# Patient Record
Sex: Female | Born: 1965 | Race: Black or African American | Hispanic: No | Marital: Single | Smoking: Former smoker
Health system: Southern US, Community
[De-identification: ages and names within clinical notes are randomized; demographics above are authoritative.]

## PROBLEM LIST (undated history)

## (undated) DIAGNOSIS — I10 Essential (primary) hypertension: Secondary | ICD-10-CM

## (undated) DIAGNOSIS — D649 Anemia, unspecified: Secondary | ICD-10-CM

## (undated) DIAGNOSIS — I2510 Atherosclerotic heart disease of native coronary artery without angina pectoris: Secondary | ICD-10-CM

## (undated) HISTORY — DX: Anemia, unspecified: D64.9

---

## 1998-12-11 ENCOUNTER — Emergency Department (HOSPITAL_COMMUNITY): Admission: EM | Admit: 1998-12-11 | Discharge: 1998-12-11 | Payer: Self-pay | Admitting: Emergency Medicine

## 2000-06-26 ENCOUNTER — Other Ambulatory Visit: Admission: RE | Admit: 2000-06-26 | Discharge: 2000-06-26 | Payer: Self-pay | Admitting: Obstetrics and Gynecology

## 2000-06-30 ENCOUNTER — Ambulatory Visit (HOSPITAL_COMMUNITY): Admission: RE | Admit: 2000-06-30 | Discharge: 2000-06-30 | Payer: Self-pay | Admitting: Obstetrics and Gynecology

## 2000-06-30 ENCOUNTER — Encounter (INDEPENDENT_AMBULATORY_CARE_PROVIDER_SITE_OTHER): Payer: Self-pay | Admitting: *Deleted

## 2006-05-18 ENCOUNTER — Ambulatory Visit (HOSPITAL_COMMUNITY): Admission: RE | Admit: 2006-05-18 | Discharge: 2006-05-18 | Payer: Self-pay | Admitting: *Deleted

## 2006-06-08 ENCOUNTER — Encounter (INDEPENDENT_AMBULATORY_CARE_PROVIDER_SITE_OTHER): Payer: Self-pay | Admitting: *Deleted

## 2006-06-08 ENCOUNTER — Ambulatory Visit (HOSPITAL_COMMUNITY): Admission: RE | Admit: 2006-06-08 | Discharge: 2006-06-08 | Payer: Self-pay | Admitting: *Deleted

## 2008-01-21 ENCOUNTER — Ambulatory Visit (HOSPITAL_COMMUNITY): Admission: RE | Admit: 2008-01-21 | Discharge: 2008-01-21 | Payer: Self-pay | Admitting: Obstetrics and Gynecology

## 2009-03-18 ENCOUNTER — Ambulatory Visit (HOSPITAL_COMMUNITY): Admission: RE | Admit: 2009-03-18 | Discharge: 2009-03-18 | Payer: Self-pay | Admitting: Obstetrics and Gynecology

## 2010-01-22 ENCOUNTER — Other Ambulatory Visit: Payer: Self-pay | Admitting: Obstetrics and Gynecology

## 2010-01-27 ENCOUNTER — Ambulatory Visit: Payer: Self-pay | Admitting: Hematology and Oncology

## 2010-02-09 ENCOUNTER — Encounter (HOSPITAL_BASED_OUTPATIENT_CLINIC_OR_DEPARTMENT_OTHER): Payer: Private Health Insurance - Indemnity | Admitting: Hematology and Oncology

## 2010-02-09 DIAGNOSIS — D509 Iron deficiency anemia, unspecified: Secondary | ICD-10-CM

## 2010-02-10 ENCOUNTER — Other Ambulatory Visit: Payer: Self-pay | Admitting: Hematology and Oncology

## 2010-02-10 ENCOUNTER — Encounter (HOSPITAL_BASED_OUTPATIENT_CLINIC_OR_DEPARTMENT_OTHER): Payer: Private Health Insurance - Indemnity | Admitting: Hematology and Oncology

## 2010-02-10 DIAGNOSIS — D509 Iron deficiency anemia, unspecified: Secondary | ICD-10-CM

## 2010-02-10 LAB — URINALYSIS, MICROSCOPIC - CHCC
Bilirubin (Urine): NEGATIVE
Glucose: NEGATIVE g/dL
Nitrite: NEGATIVE
Specific Gravity, Urine: 1.005 (ref 1.003–1.035)

## 2010-02-10 LAB — MORPHOLOGY: PLT EST: INCREASED

## 2010-02-10 LAB — COMPREHENSIVE METABOLIC PANEL
Alkaline Phosphatase: 65 U/L (ref 39–117)
BUN: 9 mg/dL (ref 6–23)
CO2: 26 mEq/L (ref 19–32)
Glucose, Bld: 97 mg/dL (ref 70–99)
Sodium: 141 mEq/L (ref 135–145)
Total Protein: 7.8 g/dL (ref 6.0–8.3)

## 2010-02-10 LAB — CBC & DIFF AND RETIC
BASO%: 1.3 % (ref 0.0–2.0)
EOS%: 9.2 % — ABNORMAL HIGH (ref 0.0–7.0)
Eosinophils Absolute: 0.5 10*3/uL (ref 0.0–0.5)
Immature Retic Fract: 19.4 % — ABNORMAL HIGH (ref 0.00–10.70)
LYMPH%: 29.4 % (ref 14.0–49.7)
MCHC: 27.9 g/dL — ABNORMAL LOW (ref 31.5–36.0)
RDW: 23.6 % — ABNORMAL HIGH (ref 11.2–14.5)
Retic %: 1.23 % (ref 0.50–1.50)
WBC: 5.3 10*3/uL (ref 3.9–10.3)
lymph#: 1.6 10*3/uL (ref 0.9–3.3)

## 2010-02-11 ENCOUNTER — Other Ambulatory Visit: Payer: Self-pay | Admitting: Obstetrics and Gynecology

## 2010-02-12 ENCOUNTER — Encounter (HOSPITAL_BASED_OUTPATIENT_CLINIC_OR_DEPARTMENT_OTHER): Payer: Private Health Insurance - Indemnity | Admitting: Hematology and Oncology

## 2010-02-12 DIAGNOSIS — D509 Iron deficiency anemia, unspecified: Secondary | ICD-10-CM

## 2010-02-12 LAB — HEMOGLOBINOPATHY EVALUATION
Hgb A2 Quant: 1.8 % — ABNORMAL LOW (ref 2.2–3.2)
Hgb F Quant: 0 % (ref 0.0–2.0)

## 2010-02-12 LAB — DIRECT ANTIGLOBULIN TEST (NOT AT ARMC)
DAT (Complement): NEGATIVE
DAT IgG: NEGATIVE

## 2010-02-12 LAB — IRON AND TIBC
Iron: 269 ug/dL — ABNORMAL HIGH (ref 42–145)
TIBC: 460 ug/dL (ref 250–470)

## 2010-02-12 LAB — FOLATE: Folate: 11.7 ng/mL

## 2010-02-12 LAB — FERRITIN: Ferritin: 5 ng/mL — ABNORMAL LOW (ref 10–291)

## 2010-03-25 ENCOUNTER — Other Ambulatory Visit: Payer: Self-pay | Admitting: Hematology and Oncology

## 2010-03-25 ENCOUNTER — Encounter (HOSPITAL_BASED_OUTPATIENT_CLINIC_OR_DEPARTMENT_OTHER): Payer: Private Health Insurance - Indemnity | Admitting: Hematology and Oncology

## 2010-03-25 DIAGNOSIS — D509 Iron deficiency anemia, unspecified: Secondary | ICD-10-CM

## 2010-03-25 LAB — CBC WITH DIFFERENTIAL/PLATELET
Basophils Absolute: 0 10*3/uL (ref 0.0–0.1)
EOS%: 8.7 % — ABNORMAL HIGH (ref 0.0–7.0)
HGB: 11.6 g/dL (ref 11.6–15.9)
MCH: 26.1 pg (ref 25.1–34.0)
MCHC: 33 g/dL (ref 31.5–36.0)
MCV: 79.1 fL — ABNORMAL LOW (ref 79.5–101.0)
MONO#: 0.3 10*3/uL (ref 0.1–0.9)
MONO%: 6.1 % (ref 0.0–14.0)
NEUT#: 2.2 10*3/uL (ref 1.5–6.5)
NEUT%: 47.3 % (ref 38.4–76.8)
WBC: 4.6 10*3/uL (ref 3.9–10.3)
nRBC: 0 % (ref 0–0)

## 2010-03-25 LAB — IRON AND TIBC
TIBC: 244 ug/dL — ABNORMAL LOW (ref 250–470)
UIBC: 181 ug/dL

## 2010-03-25 LAB — BASIC METABOLIC PANEL
BUN: 6 mg/dL (ref 6–23)
CO2: 25 mEq/L (ref 19–32)
Glucose, Bld: 101 mg/dL — ABNORMAL HIGH (ref 70–99)
Potassium: 3.6 mEq/L (ref 3.5–5.3)

## 2010-03-25 LAB — FERRITIN: Ferritin: 191 ng/mL (ref 10–291)

## 2010-04-01 ENCOUNTER — Encounter (HOSPITAL_BASED_OUTPATIENT_CLINIC_OR_DEPARTMENT_OTHER): Payer: Private Health Insurance - Indemnity | Admitting: Hematology and Oncology

## 2010-04-01 DIAGNOSIS — D509 Iron deficiency anemia, unspecified: Secondary | ICD-10-CM

## 2010-04-12 ENCOUNTER — Other Ambulatory Visit (HOSPITAL_COMMUNITY): Payer: Self-pay | Admitting: Obstetrics and Gynecology

## 2010-04-12 DIAGNOSIS — Z1231 Encounter for screening mammogram for malignant neoplasm of breast: Secondary | ICD-10-CM

## 2010-04-16 ENCOUNTER — Ambulatory Visit (HOSPITAL_COMMUNITY): Payer: Private Health Insurance - Indemnity

## 2010-04-23 ENCOUNTER — Ambulatory Visit (HOSPITAL_COMMUNITY): Payer: Private Health Insurance - Indemnity

## 2010-04-30 ENCOUNTER — Ambulatory Visit (HOSPITAL_COMMUNITY)
Admission: RE | Admit: 2010-04-30 | Discharge: 2010-04-30 | Disposition: A | Discharge status: Home / Self Care | Payer: Private Health Insurance - Indemnity | Source: Ambulatory Visit | Attending: Obstetrics and Gynecology | Admitting: Obstetrics and Gynecology

## 2010-04-30 DIAGNOSIS — Z1231 Encounter for screening mammogram for malignant neoplasm of breast: Secondary | ICD-10-CM

## 2010-05-18 NOTE — Op Note (Signed)
NAME:  Yolanda Barr, Yolanda Barr NO.:  [id number]   MEDICAL RECORD NO.:  [id number]          PATIENT TYPE:  AMB   LOCATION:  SDC                           FACILITY:  [facility]   PHYSICIAN:   B. Earlene Plater, M.D.  DATE OF BIRTH:  [birth year]   DATE OF PROCEDURE:  06/08/2006  DATE OF DISCHARGE:                               OPERATIVE REPORT   PREOPERATIVE DIAGNOSIS:  Abnormal uterine bleeding and submucosal  fibroid.   POSTOPERATIVE DIAGNOSIS:  Abnormal uterine bleeding and submucosal  fibroid.   PROCEDURE:  Hysteroscopy with resection of submucosal fibroid.   SURGEON:  Chester Holstein. Earlene Plater, M.D.   ASSISTANT:  None.   ANESTHESIA:  LMA general.   SPECIMENS:  Submucosal fibroid to pathology.   BLOOD LOSS:  Minimal.   FLUID DEFICIENT:  350 mL sorbitol.   INDICATIONS:  Patient with a history of heavy menstrual bleeding with  associated anemia, initially down into the 7 range. Ultrasound showed a  uterine fibroid, submucosal probably. Saline infusion ultrasound  confirmed presence of a large submucosal fibroid. The patient was  advised of the risks of surgery including infection, bleeding, uterine  perforation and fluid overload.   PROCEDURE:  The patient was taken to the operating room and LMA  anesthesia obtained. She was prepped and draped in the standard fashion.  Bladder emptied with an in-and-out catheter. Exam under anesthesia had  approximately 12-week size uterus which was anteverted; no adnexal  masses.   Speculum inserted. Paracervical block placed with 20 mL of 1% Nesacaine.   Single tooth was attached to the anterior lip after speculum placed.  Cervix easily allowed passage up to the #31 dilator.   The resectoscope was inserted after being flushed with sorbitol. Good  uterine distention obtained. The primary finding was an approximately 4  cm submucosal fibroid arising from the left anterior fundal region. In  addition, an approximately 1 cm submucosal fibroid  noted from the right  lateral anterior region. Each was resected to its base with the single  loop. Hemostasis obtained. All debris removed and cervix inspected,  found to be hemostatic.   The patient tolerated the procedure well with no complications. She was  taken to the recovery room awake, alert, in stable condition.      Gerri Spore B. Earlene Plater, M.D.  Electronically Signed     WBD/MEDQ  D:  06/08/2006  T:  06/08/2006  Job:  [id number]

## 2010-05-21 NOTE — Op Note (Signed)
[facility]  Patient:    Yolanda Barr, Yolanda Barr                    MRN: [id number] Proc. Date: 06/30/00 Adm. Date:  [id number] Attending:  Conley Simmonds A                           Operative Report  PREOPERATIVE DIAGNOSIS:       Prolapse of cervical fibroids.  POSTOPERATIVE DIAGNOSIS:      Prolapse of cervical fibroids.  OPERATION:                    Cervical myomectomy.  SURGEON:                      Brook A. Edward Jolly, M.D.  ANESTHESIA:                   MAC, paracervical block with 1% lidocaine.  IV FLUIDS:                    13000 cc Ringers lactate.  Estimated blood loss 50 cc.  Urine output 10 cc prior to procedure and 600 cc after procedure.  COMPLICATIONS:  None.  INDICATIONS FOR PROCEDURE:  The patient is a 45 year old, gravida 15, PARA 2-0-2-2, African-American female who presented to the office with menorrhagia. The patient was noted to have a prolapsing cervical fibroid at that time, and ultrasound confirmed the presence of a 3.7 cm cervical fibroid in addition to a 1.8 cm fundal fibroid.  The ovaries were noted to be normal.  The patients hemoglobin was noted to be 8.3, and she had been previously diagnosed with anemia which was under treatment with iron therapy.  A discussion was held with the patient regarding her diagnosis and options for care, and a decision was made to proceed with a cervical myomectomy after the risks and benefits were discussed with her.  FINDINGS:  Examination under anesthesia revealed a 4 x 2 cm cervical prolapsing myoma on a large stalk which was attached to the left anterior cervix near the level of the internal os.  The uterus was noted to be small, anteverted, and mobile.  No adnexal masses were appreciated.  SPECIMENS:  Cervical fibroids.  DESCRIPTION OF PROCEDURE:  With an IV placed, the patient was taken to the operating suite after she was properly identified.  The patient did receive Ancef 1 g IV for  antibiotic prophylaxis.  The patient was placed in the supine position on the operating room table, and MAC anesthesia was begun.  The patient was placed in the dorsolithotomy position, and the perineum and vagina were sterilely prepped and draped.  The bladder was emptied of urine with a sterile red rubber catheter.  Examination under anesthesia was performed, and the findings are as noted above.  A speculum was placed inside the vagina, and the prolapsing fibroid was identified.  It was grasped with tenaculum, and monopolar cautery was used to begin with the myomectomy at the base of the fibroid.  An attempt was made to clamp the remaining stalk of the cervical fibroid and place a free tie; however, this was not easily accessible.  The remainder of the base of the cervical fibroid was, therefore, cauterized using monopolar cautery. Hemostasis then became good.  To ensure ongoing hemostasis, at this time a figure-of-eight suture of #1 chromic was placed externally on the  cervix at the level of the internal os in order to achieve occlusion of the descending branches of the uterine artery bilaterally.  At the end of the procedure, hemostasis was noted to be excellent.  The bladder was again catheterized, and clear urine was noted.  The patient was taken out of the dorsolithotomy position after all instruments were removed from the vagina.  She was escorted to the recovery room in stable and awake condition.  There were no complications of the procedure.  All needle, instrument, and laparotomy pack counts were correct. DD:  06/30/00 TD:  06/30/00 Job: [id number]

## 2010-06-30 ENCOUNTER — Other Ambulatory Visit: Payer: Self-pay | Admitting: Hematology and Oncology

## 2010-06-30 ENCOUNTER — Encounter (HOSPITAL_BASED_OUTPATIENT_CLINIC_OR_DEPARTMENT_OTHER): Payer: Private Health Insurance - Indemnity | Admitting: Hematology and Oncology

## 2010-06-30 DIAGNOSIS — D509 Iron deficiency anemia, unspecified: Secondary | ICD-10-CM

## 2010-06-30 LAB — BASIC METABOLIC PANEL
Calcium: 9.3 mg/dL (ref 8.4–10.5)
Potassium: 4.2 mEq/L (ref 3.5–5.3)
Sodium: 138 mEq/L (ref 135–145)

## 2010-06-30 LAB — IRON AND TIBC
%SAT: 14 % — ABNORMAL LOW (ref 20–55)
Iron: 48 ug/dL (ref 42–145)
TIBC: 348 ug/dL (ref 250–470)
UIBC: 300 ug/dL

## 2010-06-30 LAB — CBC WITH DIFFERENTIAL/PLATELET
BASO%: 1.3 % (ref 0.0–2.0)
Basophils Absolute: 0.1 10*3/uL (ref 0.0–0.1)
EOS%: 7.9 % — ABNORMAL HIGH (ref 0.0–7.0)
HGB: 12.2 g/dL (ref 11.6–15.9)
LYMPH%: 33.7 % (ref 14.0–49.7)
MCH: 31 pg (ref 25.1–34.0)
MCHC: 33.7 g/dL (ref 31.5–36.0)
MONO#: 0.3 10*3/uL (ref 0.1–0.9)
NEUT#: 2.1 10*3/uL (ref 1.5–6.5)
RBC: 3.95 10*6/uL (ref 3.70–5.45)
WBC: 4.3 10*3/uL (ref 3.9–10.3)

## 2010-07-02 ENCOUNTER — Encounter (HOSPITAL_BASED_OUTPATIENT_CLINIC_OR_DEPARTMENT_OTHER): Payer: Private Health Insurance - Indemnity | Admitting: Hematology and Oncology

## 2010-07-02 DIAGNOSIS — D509 Iron deficiency anemia, unspecified: Secondary | ICD-10-CM

## 2010-07-30 ENCOUNTER — Encounter (HOSPITAL_BASED_OUTPATIENT_CLINIC_OR_DEPARTMENT_OTHER): Payer: Private Health Insurance - Indemnity | Admitting: Hematology and Oncology

## 2010-07-30 DIAGNOSIS — N920 Excessive and frequent menstruation with regular cycle: Secondary | ICD-10-CM

## 2010-07-30 DIAGNOSIS — D649 Anemia, unspecified: Secondary | ICD-10-CM

## 2010-10-21 LAB — DIFFERENTIAL
Basophils Absolute: 0.1
Basophils Relative: 1
Monocytes Absolute: 0.3
Neutro Abs: 2.3
Neutrophils Relative %: 53

## 2010-10-21 LAB — CBC
MCHC: 30.8
Platelets: 393
RDW: 31.6 — ABNORMAL HIGH

## 2010-11-09 ENCOUNTER — Other Ambulatory Visit: Payer: Self-pay | Admitting: Hematology and Oncology

## 2010-11-09 ENCOUNTER — Encounter: Payer: Self-pay | Admitting: *Deleted

## 2010-11-09 ENCOUNTER — Other Ambulatory Visit (HOSPITAL_BASED_OUTPATIENT_CLINIC_OR_DEPARTMENT_OTHER): Payer: Private Health Insurance - Indemnity | Admitting: Lab

## 2010-11-09 DIAGNOSIS — D509 Iron deficiency anemia, unspecified: Secondary | ICD-10-CM

## 2010-11-09 LAB — BASIC METABOLIC PANEL
BUN: 6 mg/dL (ref 6–23)
Calcium: 9.4 mg/dL (ref 8.4–10.5)
Creatinine, Ser: 0.76 mg/dL (ref 0.50–1.10)
Glucose, Bld: 78 mg/dL (ref 70–99)
Potassium: 3.4 mEq/L — ABNORMAL LOW (ref 3.5–5.3)

## 2010-11-09 LAB — CBC WITH DIFFERENTIAL/PLATELET
Basophils Absolute: 0 10*3/uL (ref 0.0–0.1)
EOS%: 6.2 % (ref 0.0–7.0)
Eosinophils Absolute: 0.3 10*3/uL (ref 0.0–0.5)
HCT: 36.4 % (ref 34.8–46.6)
HGB: 12.1 g/dL (ref 11.6–15.9)
LYMPH%: 33.8 % (ref 14.0–49.7)
MCH: 30.8 pg (ref 25.1–34.0)
MCV: 92.5 fL (ref 79.5–101.0)
MONO%: 7 % (ref 0.0–14.0)
NEUT#: 2.3 10*3/uL (ref 1.5–6.5)
NEUT%: 52 % (ref 38.4–76.8)
Platelets: 357 10*3/uL (ref 145–400)

## 2010-11-09 LAB — IRON AND TIBC: %SAT: 9 % — ABNORMAL LOW (ref 20–55)

## 2010-11-09 LAB — FERRITIN: Ferritin: 16 ng/mL (ref 10–291)

## 2010-11-13 NOTE — Progress Notes (Signed)
This encounter was created in error - please disregard.

## 2011-01-15 ENCOUNTER — Telehealth: Payer: Self-pay | Admitting: Hematology and Oncology

## 2011-01-15 NOTE — Telephone Encounter (Signed)
lmonvm for pt re appts for 1/31 and 2/6. Schedule mailed today.

## 2011-01-31 ENCOUNTER — Encounter: Payer: Self-pay | Admitting: Nurse Practitioner

## 2011-02-02 ENCOUNTER — Other Ambulatory Visit: Payer: Self-pay | Admitting: Nurse Practitioner

## 2011-02-02 ENCOUNTER — Other Ambulatory Visit: Payer: Private Health Insurance - Indemnity | Admitting: Lab

## 2011-02-02 DIAGNOSIS — D509 Iron deficiency anemia, unspecified: Secondary | ICD-10-CM

## 2011-02-09 ENCOUNTER — Ambulatory Visit: Payer: Private Health Insurance - Indemnity | Admitting: Hematology and Oncology

## 2011-02-10 ENCOUNTER — Telehealth: Payer: Self-pay | Admitting: *Deleted

## 2011-02-10 NOTE — Telephone Encounter (Signed)
Pt returned nurse's call.    Informed pt that pt will be given an appt  As soon as schedule slot avaiable.   Instructed pt to f/u with her primary for  any issues until pt has appt with Dr. Dalene Carrow.   Pt voiced understanding. Pt's  Phone     [phone].

## 2011-02-25 ENCOUNTER — Other Ambulatory Visit: Payer: Self-pay | Admitting: *Deleted

## 2011-02-28 ENCOUNTER — Other Ambulatory Visit: Payer: Self-pay | Admitting: *Deleted

## 2011-03-02 ENCOUNTER — Telehealth: Payer: Self-pay | Admitting: Hematology and Oncology

## 2011-03-02 NOTE — Telephone Encounter (Signed)
lmonvm for pt re appt for 3/6 and 3/13. Schedule mailed.

## 2011-03-09 ENCOUNTER — Other Ambulatory Visit (HOSPITAL_BASED_OUTPATIENT_CLINIC_OR_DEPARTMENT_OTHER): Payer: Private Health Insurance - Indemnity | Admitting: Lab

## 2011-03-09 DIAGNOSIS — D509 Iron deficiency anemia, unspecified: Secondary | ICD-10-CM

## 2011-03-09 LAB — CBC WITH DIFFERENTIAL/PLATELET
Basophils Absolute: 0 10*3/uL (ref 0.0–0.1)
EOS%: 5.5 % (ref 0.0–7.0)
HCT: 29 % — ABNORMAL LOW (ref 34.8–46.6)
HGB: 9 g/dL — ABNORMAL LOW (ref 11.6–15.9)
LYMPH%: 35.7 % (ref 14.0–49.7)
MCH: 23 pg — ABNORMAL LOW (ref 25.1–34.0)
NEUT%: 50.2 % (ref 38.4–76.8)
Platelets: 336 10*3/uL (ref 145–400)
lymph#: 1.7 10*3/uL (ref 0.9–3.3)

## 2011-03-09 LAB — IRON AND TIBC
%SAT: 3 % — ABNORMAL LOW (ref 20–55)
Iron: 11 ug/dL — ABNORMAL LOW (ref 42–145)
UIBC: 403 ug/dL — ABNORMAL HIGH (ref 125–400)

## 2011-03-09 LAB — BASIC METABOLIC PANEL
Calcium: 9.2 mg/dL (ref 8.4–10.5)
Sodium: 137 mEq/L (ref 135–145)

## 2011-03-16 ENCOUNTER — Ambulatory Visit (HOSPITAL_BASED_OUTPATIENT_CLINIC_OR_DEPARTMENT_OTHER): Payer: Private Health Insurance - Indemnity | Admitting: Hematology and Oncology

## 2011-03-16 ENCOUNTER — Other Ambulatory Visit: Payer: Self-pay | Admitting: *Deleted

## 2011-03-16 ENCOUNTER — Encounter: Payer: Self-pay | Admitting: Hematology and Oncology

## 2011-03-16 VITALS — BP 125/72 | HR 59 | Temp 97.2°F | Ht 64.0 in | Wt 173.5 lb

## 2011-03-16 DIAGNOSIS — D539 Nutritional anemia, unspecified: Secondary | ICD-10-CM

## 2011-03-16 DIAGNOSIS — N924 Excessive bleeding in the premenopausal period: Secondary | ICD-10-CM

## 2011-03-16 MED ORDER — FE FUM-VIT C-VIT B12-FA 460-60-0.01-1 MG PO CAPS: 1.0000 | ORAL_CAPSULE | Freq: Every day | ORAL | Status: DC

## 2011-03-16 NOTE — Patient Instructions (Signed)
Patient to follow up as instructed.   Current Outpatient Prescriptions  Medication Sig Dispense Refill  . Fe Fum-Vit C-Vit B12-FA (HEMATOGEN FORTE PO) Take 1 tablet by mouth daily.      . Multiple Vitamins-Iron (ONE-TABLET-DAILY/IRON) TABS Take 1 tablet by mouth daily.        . psyllium (HYDROCIL/METAMUCIL) 95 % PACK Take 3 packets by mouth every other day.              March 2013  Sunday Monday Tuesday Wednesday Thursday Friday Saturday                  1   2    3   4   5   6    LAB MO     3:30 PM  (15 min.)  Delcie Roch  [facility] MEDICAL ONCOL[facility] 7   8   9    10   11   12   13    EST PT 30  12:15 PM  (30 min.)  Laurice Record, MD  [facility] MEDICAL ONCOLOGY 14   15   16    17   18   19   20   21   22   23    24   25   26   27   28   29   30    31                             Laurice Record., M.D.

## 2011-03-16 NOTE — Progress Notes (Signed)
This office note has been dictated.

## 2011-03-17 NOTE — Progress Notes (Signed)
CC:   Lynden Ang, NP Jyothi Elsie Amis, MD, [facility] Rubin Payor, DO  IDENTIFYING STATEMENT:  The patient is a 46 year old woman with anemia who presents for followup.  INTERVAL HISTORY:  Ms. Reardon has had scheduling issues with following up here at the [facility].  She missed the last scheduled followup Appointments. She reports fatigue.  She continues to have heavy menses.  She has not taken oral iron. Her prescription ran out.  Has had no rectal bleeding.  03/09/2011, labs are showing a hemoglobin of 9, hematocrit 29, iron is 11, TIBC 4 and 4, saturation 3%, and ferritin 2.  MEDICATIONS:  Multivitamins.  ALLERGIES:  None.  PAST MEDICAL HISTORY/FAMILY HISTORY/SOCIAL HISTORY:  Unchanged.  REVIEW OF SYSTEMS:  As above.  PHYSICAL EXAMINATION:  Patient is alert, oriented x3.  Vitals:  Pulse 59, blood pressure 125/72,  temperature 97.2, respirations 20, weight 173 pounds.  HEENT: Head is atraumatic, normocephalic.  Sclerae anicteric.  Mouth moist.  Chest:  Clear.  CVS:  Unremarkable.  Abdomen: Soft, nontender.  Extremities:  No edema.  LAB DATA:  As above.  In addition, white cell count 4.9, platelets 366, sodium 137, potassium 3.6, chloride 104, CO2 of 22, BUN 8, creatinine 0.74 calcium 9.2.  IMPRESSION AND PLAN:  Ms. Thien is a 46 year old woman with iron- deficiency anemia secondary to menorrhagia.  She also has fibroids.  She last received IV iron in the form of InFeD on February 12, 2010.  She is symptomatic and iron levels and ferritin levels are low.  I have recommended iron in the form of Feraheme.  I suggested she received it sooner rather than later versus a blood transfusion.  She opted for IV Iron scheduled on March 18, 2011.  She will resume oral iron. She follows up in 6 weeks time.    ______________________________ Laurice Record, M.D. LIO/MEDQ  D:  03/16/2011  T:  03/16/2011  Job:  [id number]

## 2011-03-18 ENCOUNTER — Ambulatory Visit: Payer: Private Health Insurance - Indemnity

## 2011-04-08 ENCOUNTER — Telehealth: Payer: Self-pay | Admitting: *Deleted

## 2011-04-08 ENCOUNTER — Telehealth: Payer: Self-pay | Admitting: Hematology and Oncology

## 2011-04-08 ENCOUNTER — Other Ambulatory Visit: Payer: Self-pay | Admitting: *Deleted

## 2011-04-08 NOTE — Telephone Encounter (Signed)
Pt called requesting a rescheduled appt for Feraheme infusion since pt missed appt on  03/18/11.    Transferred pt's call to [facility], infusion scheduler for rescheduling. Pt also would like to find out if there is another iron pill that md could prescribe for pt -   Hematogen Forte was too strong for pt , making her very sleepy.    Pt would like something less strong.

## 2011-04-08 NOTE — Telephone Encounter (Signed)
Spoke with pt and informed pt re:  Hematogen Forte is best iron for pt since it has other vitamins included and does not cause constipation like regular iron pill.   Confirmed with pt dates and times for all appts.   Pt voiced understanding.

## 2011-04-08 NOTE — Telephone Encounter (Signed)
Per 4/5 pof r/s'd may appts to June. D/t per pof. lmonvm for pt re change w/new d/t's for 6/3 and 6/7. Also confirmed 4/16 appt. April/june schedule mailed today.

## 2011-04-19 ENCOUNTER — Ambulatory Visit (HOSPITAL_BASED_OUTPATIENT_CLINIC_OR_DEPARTMENT_OTHER): Payer: Private Health Insurance - Indemnity

## 2011-04-19 VITALS — BP 142/85 | HR 68 | Temp 97.7°F

## 2011-04-19 DIAGNOSIS — D539 Nutritional anemia, unspecified: Secondary | ICD-10-CM

## 2011-04-19 MED ORDER — SODIUM CHLORIDE 0.9 % IV SOLN: Freq: Once | INTRAVENOUS | Status: DC

## 2011-04-19 MED ORDER — SODIUM CHLORIDE 0.9 % IV SOLN
1020.0000 mg | Freq: Once | INTRAVENOUS | Status: AC
  Administered 2011-04-19: 1020 mg via INTRAVENOUS
  Filled 2011-04-19: qty 34

## 2011-04-19 MED ORDER — SODIUM CHLORIDE 0.9 % IJ SOLN
3.0000 mL | Freq: Once | INTRAMUSCULAR | Status: DC | PRN
  Filled 2011-04-19: qty 10

## 2011-04-20 ENCOUNTER — Other Ambulatory Visit (HOSPITAL_COMMUNITY): Payer: Self-pay | Admitting: Obstetrics and Gynecology

## 2011-04-20 DIAGNOSIS — Z1231 Encounter for screening mammogram for malignant neoplasm of breast: Secondary | ICD-10-CM

## 2011-05-06 ENCOUNTER — Other Ambulatory Visit: Payer: Private Health Insurance - Indemnity | Admitting: Lab

## 2011-05-11 ENCOUNTER — Other Ambulatory Visit: Payer: Private Health Insurance - Indemnity | Admitting: Lab

## 2011-05-11 ENCOUNTER — Ambulatory Visit: Payer: Private Health Insurance - Indemnity | Admitting: Hematology and Oncology

## 2011-05-17 ENCOUNTER — Ambulatory Visit (HOSPITAL_COMMUNITY): Payer: Private Health Insurance - Indemnity

## 2011-06-06 ENCOUNTER — Other Ambulatory Visit (HOSPITAL_BASED_OUTPATIENT_CLINIC_OR_DEPARTMENT_OTHER): Payer: Private Health Insurance - Indemnity | Admitting: Lab

## 2011-06-06 DIAGNOSIS — D539 Nutritional anemia, unspecified: Secondary | ICD-10-CM

## 2011-06-06 LAB — IRON AND TIBC
TIBC: 273 ug/dL (ref 250–470)
UIBC: 228 ug/dL (ref 125–400)

## 2011-06-06 LAB — CBC WITH DIFFERENTIAL/PLATELET
Basophils Absolute: 0.1 10*3/uL (ref 0.0–0.1)
Eosinophils Absolute: 0.2 10*3/uL (ref 0.0–0.5)
HCT: 35.7 % (ref 34.8–46.6)
HGB: 12 g/dL (ref 11.6–15.9)
MONO#: 0.2 10*3/uL (ref 0.1–0.9)
NEUT%: 55.7 % (ref 38.4–76.8)
WBC: 4.2 10*3/uL (ref 3.9–10.3)
lymph#: 1.5 10*3/uL (ref 0.9–3.3)

## 2011-06-06 LAB — FERRITIN: Ferritin: 58 ng/mL (ref 10–291)

## 2011-06-10 ENCOUNTER — Encounter: Payer: Self-pay | Admitting: Hematology and Oncology

## 2011-06-10 ENCOUNTER — Telehealth: Payer: Self-pay | Admitting: Hematology and Oncology

## 2011-06-10 ENCOUNTER — Ambulatory Visit (HOSPITAL_BASED_OUTPATIENT_CLINIC_OR_DEPARTMENT_OTHER): Payer: Private Health Insurance - Indemnity | Admitting: Hematology and Oncology

## 2011-06-10 ENCOUNTER — Telehealth: Payer: Self-pay | Admitting: *Deleted

## 2011-06-10 VITALS — BP 138/75 | HR 61 | Temp 98.9°F | Ht 64.0 in | Wt 169.4 lb

## 2011-06-10 DIAGNOSIS — D539 Nutritional anemia, unspecified: Secondary | ICD-10-CM

## 2011-06-10 DIAGNOSIS — D500 Iron deficiency anemia secondary to blood loss (chronic): Secondary | ICD-10-CM

## 2011-06-10 NOTE — Patient Instructions (Signed)
Yolanda Barr  [id number]  [facility] Discharge Instructions  RECOMMENDATIONS MADE BY THE CONSULTANT AND ANY TEST RESULTS WILL BE SENT TO YOUR REFERRING DOCTOR.   EXAM FINDINGS BY MD TODAY AND SIGNS AND SYMPTOMS TO REPORT TO CLINIC OR PRIMARY MD:   Your current list of medications are: Current Outpatient Prescriptions  Medication Sig Dispense Refill  . Fe Fum-Vit C-Vit B12-FA (HEMATOGEN FORTE) 460-60-0.01-1 MG CAPS Take 1 capsule by mouth daily.  30 capsule  3  . Multiple Vitamins-Iron (ONE-TABLET-DAILY/IRON) TABS Take 1 tablet by mouth daily.        . psyllium (HYDROCIL/METAMUCIL) 95 % PACK Take 3 packets by mouth every other day.           INSTRUCTIONS GIVEN AND DISCUSSED:   SPECIAL INSTRUCTIONS/FOLLOW-UP:  See above.  I acknowledge that I have been informed and understand all the instructions given to me and received a copy. I do not have any more questions at this time, but understand that I may call the [facility] at [phone] during business hours should I have any further questions or need assistance in obtaining follow-up care.

## 2011-06-10 NOTE — Telephone Encounter (Signed)
Per staff message from Anne, I have scheduled treatment appts. JMW  

## 2011-06-10 NOTE — Progress Notes (Signed)
This office note has been dictated.

## 2011-06-10 NOTE — Telephone Encounter (Signed)
appts made and printed for pt aom °

## 2011-06-10 NOTE — Telephone Encounter (Signed)
l/m that iron had been added to 10/29 visit  aom

## 2011-06-13 NOTE — Progress Notes (Signed)
CC:   Yolanda Ang, NP Jyothi Elsie Amis, MD, [facility] [place], [place], [place] [phone]  IDENTIFYING STATEMENT:  Patient is a 46 year old woman with anemia who presents for followup.  Yolanda Barr received Feraheme on 03/18/2011.  She tolerated the infusion well.  Noted vast improvement in her overall well being.  Had excellent energy levels.  She is also currently taking prescription iron which she appears to be tolerating with very minimal nausea.  Continues to have heavy menses.  Results of the blood work on 06/06/2011 notes hemoglobin and hematocrit of 12 and 35.7 (9 and 29); ferritin 58 (2), iron 45 (11), TIBC 273 (414), saturation 16% (3%).  MEDICATIONS:  Hematogen Forte 1 daily and multivitamins.  PHYSICAL EXAM:  Patient is alert and oriented x3.  Vitals:  Pulse 61, blood pressure 138/75, temperature 98.9, respirations 18, weight 169.4 pounds.  HEENT:  Head is atraumatic, normocephalic.  Sclerae anicteric. Mouth moist.  Chest:  Clear.  Abdomen:  Soft, nontender.  Bowel sounds present.  Extremities:  No edema.  LAB DATA:  As above.  In addition, white cell count 4.2, platelets 324.  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  She is status post IV iron in the form of INFeD on 03/18/2011.  Ferritin levels have improved.  She will continue with oral iron.  Follows up in 3-4 months' time.    ______________________________ Laurice Record, M.D. LIO/MEDQ  D:  06/10/2011  T:  06/10/2011  Job:  [id number]

## 2011-06-14 ENCOUNTER — Ambulatory Visit (HOSPITAL_COMMUNITY)
Admission: RE | Admit: 2011-06-14 | Discharge: 2011-06-14 | Disposition: A | Discharge status: Home / Self Care | Payer: Private Health Insurance - Indemnity | Source: Ambulatory Visit | Attending: Obstetrics and Gynecology | Admitting: Obstetrics and Gynecology

## 2011-06-14 DIAGNOSIS — Z1231 Encounter for screening mammogram for malignant neoplasm of breast: Secondary | ICD-10-CM | POA: Insufficient documentation

## 2011-06-14 IMAGING — MG MM DIGITAL SCREENING BILAT
4 series · 4 of 4 positions shown · non-contrast
Comparison: Previous exams

CLINICAL DATA: Screening.

MAMMOGRAPHIC BILATERAL DIGITAL SCREENING WITH CAD

[R CC]
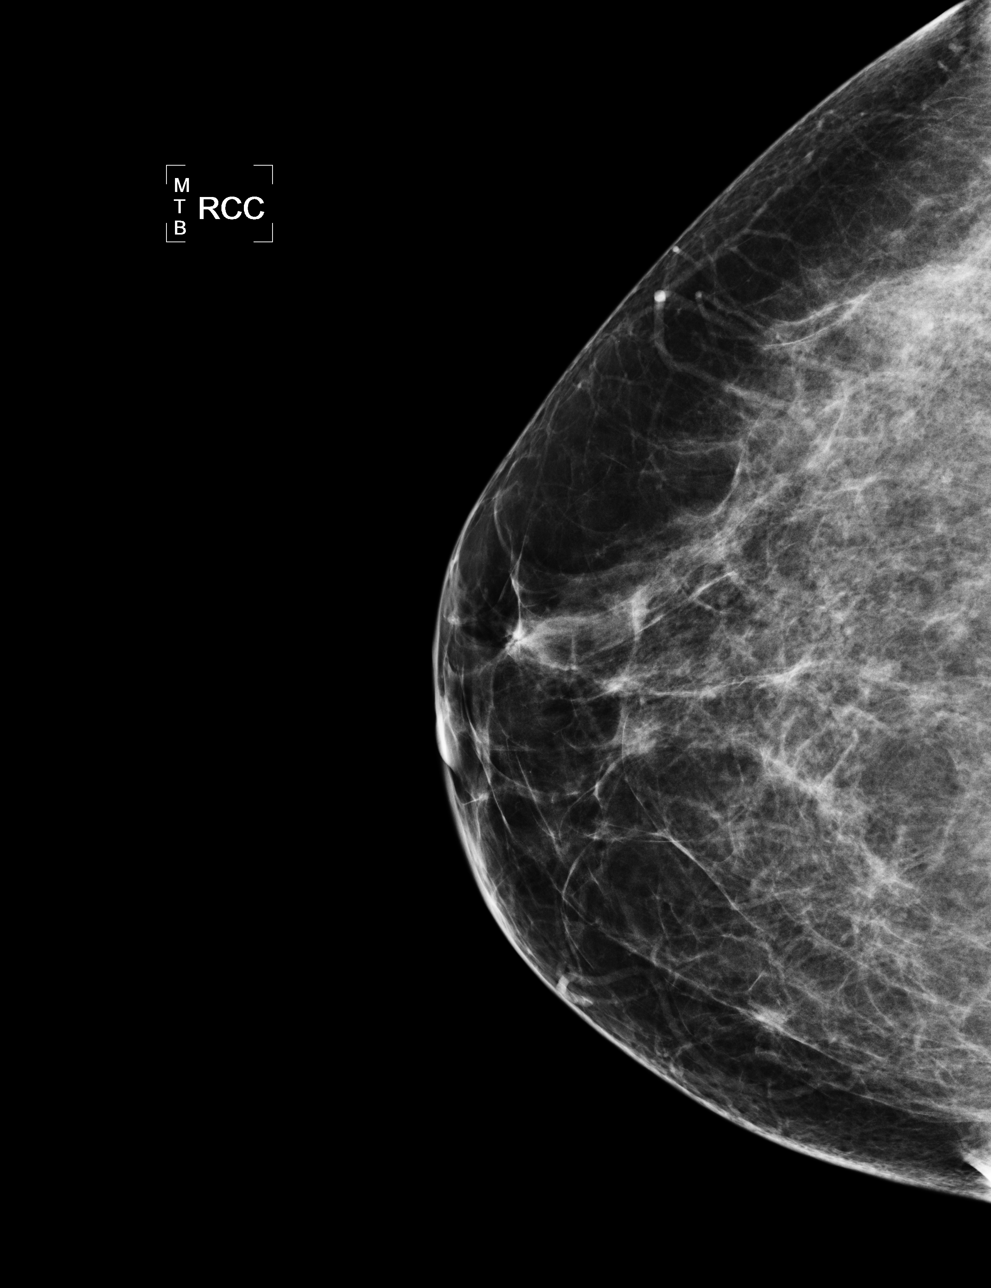

[R MLO]
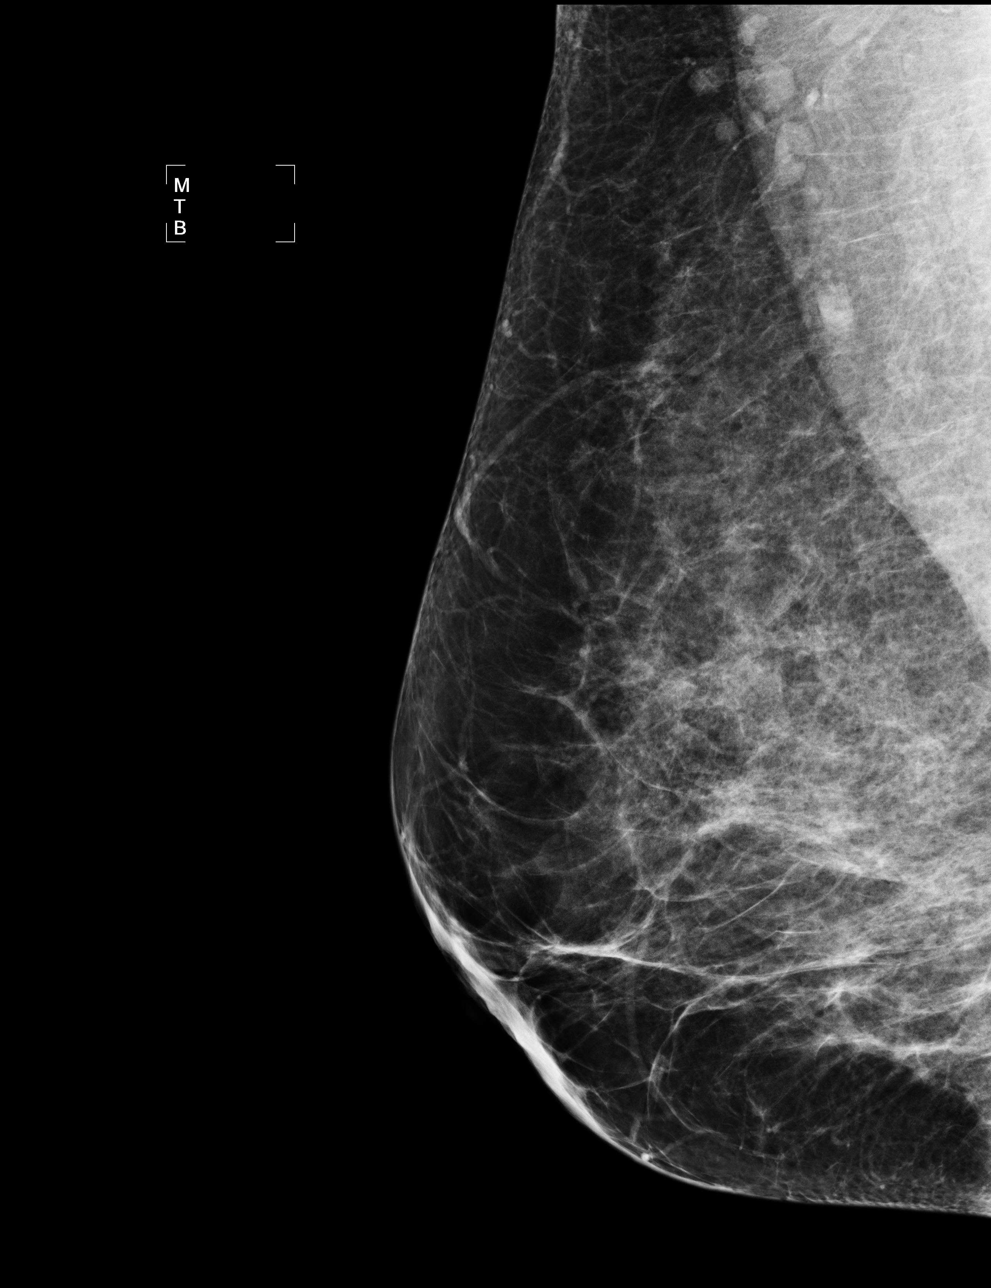

[L CC]
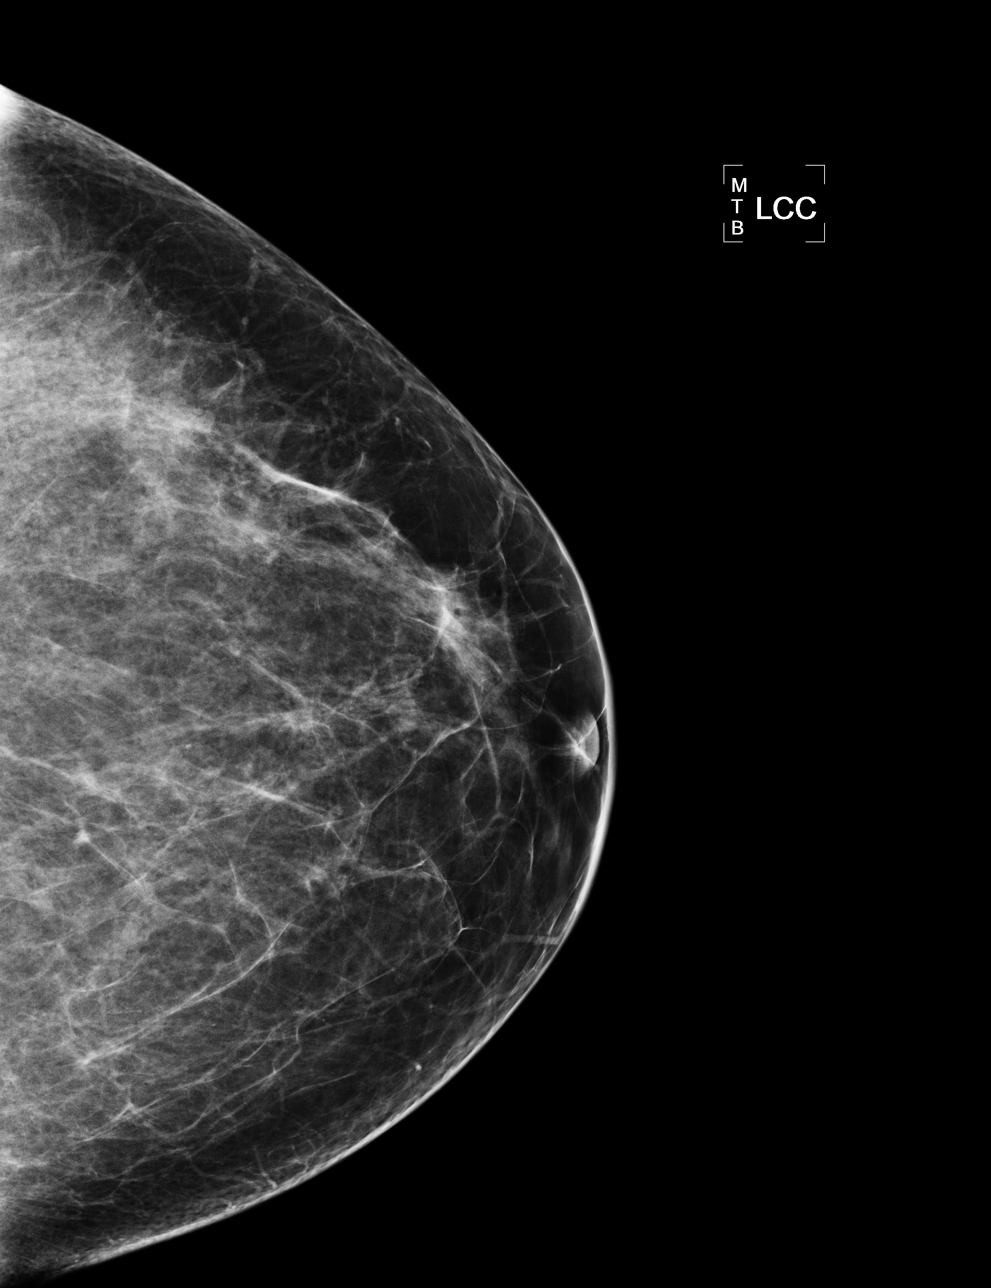

[L MLO]
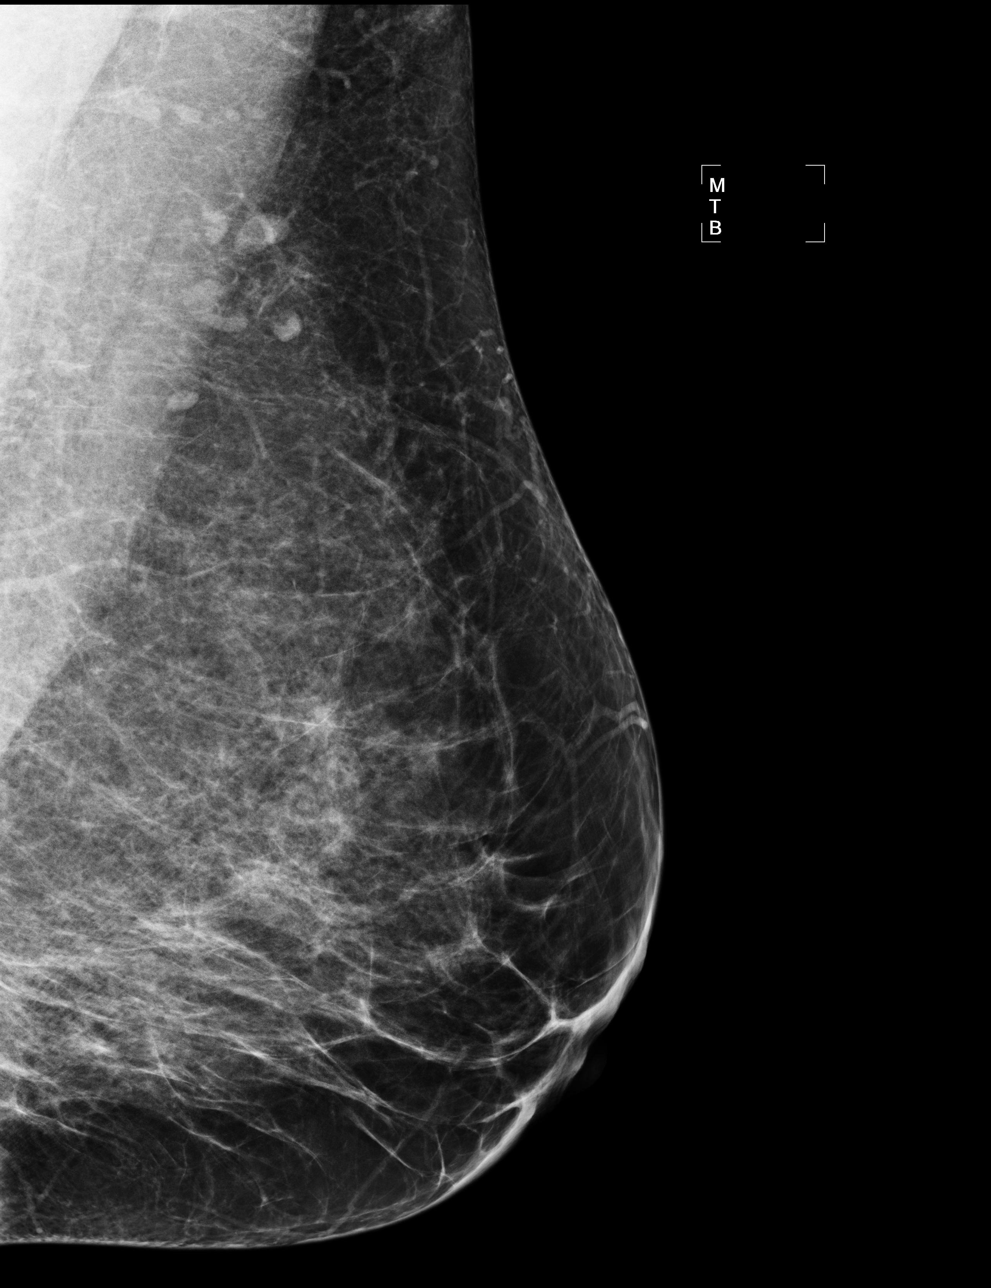

[4 of 4 positions shown; findings below may reference images not displayed]

FINDINGS: There are scattered fibroglandular densities. No
suspicious masses, architectural distortion, or calcifications are
present.

Images were processed with CAD.
IMPRESSION: No specific mammographic evidence of malignancy.

A result letter of this screening mammogram will be mailed directly
to the patient.

RECOMMENDATION:
Screening mammogram in one year. (Code:[10])

BI-RADS CATEGORY 1:  Negative

## 2011-09-26 ENCOUNTER — Telehealth: Payer: Self-pay | Admitting: *Deleted

## 2011-09-26 NOTE — Telephone Encounter (Signed)
Rec'd call from [facility] with [facility]..... Unable to currently get Hematogen Forte in name brand or generic from wholesale or other stores.  What substitution or equivalent would Dr Dalene Carrow like to prescribe?

## 2011-09-28 ENCOUNTER — Other Ambulatory Visit: Payer: Self-pay | Admitting: Nurse Practitioner

## 2011-09-28 MED ORDER — POLYSACCHARIDE IRON COMPLEX 150 MG PO CAPS: 150.0000 mg | ORAL_CAPSULE | Freq: Every day | ORAL | Status: DC

## 2011-10-28 ENCOUNTER — Other Ambulatory Visit: Payer: Private Health Insurance - Indemnity | Admitting: Lab

## 2011-10-28 ENCOUNTER — Telehealth: Payer: Self-pay | Admitting: Hematology and Oncology

## 2011-10-28 NOTE — Telephone Encounter (Signed)
pt called to cx all her appts and states that she will c/b when she would like to r/s  ------cx due to $$    aom

## 2011-11-01 ENCOUNTER — Ambulatory Visit: Payer: Private Health Insurance - Indemnity | Admitting: Hematology and Oncology

## 2011-11-01 ENCOUNTER — Ambulatory Visit: Payer: Private Health Insurance - Indemnity

## 2012-08-24 ENCOUNTER — Other Ambulatory Visit (HOSPITAL_COMMUNITY): Payer: Self-pay | Admitting: Obstetrics and Gynecology

## 2012-08-24 DIAGNOSIS — Z1231 Encounter for screening mammogram for malignant neoplasm of breast: Secondary | ICD-10-CM

## 2012-08-29 ENCOUNTER — Ambulatory Visit (HOSPITAL_COMMUNITY): Admission: RE | Admit: 2012-08-29 | Payer: Private Health Insurance - Indemnity | Source: Ambulatory Visit

## 2012-09-13 ENCOUNTER — Ambulatory Visit (HOSPITAL_COMMUNITY): Payer: Private Health Insurance - Indemnity | Attending: Obstetrics and Gynecology

## 2012-09-25 ENCOUNTER — Ambulatory Visit (HOSPITAL_COMMUNITY)
Admission: RE | Admit: 2012-09-25 | Discharge: 2012-09-25 | Disposition: A | Discharge status: Home / Self Care | Payer: Private Health Insurance - Indemnity | Source: Ambulatory Visit | Attending: Obstetrics and Gynecology | Admitting: Obstetrics and Gynecology

## 2012-09-25 DIAGNOSIS — Z1231 Encounter for screening mammogram for malignant neoplasm of breast: Secondary | ICD-10-CM

## 2013-10-08 ENCOUNTER — Other Ambulatory Visit (HOSPITAL_COMMUNITY): Payer: Self-pay | Admitting: Obstetrics and Gynecology

## 2013-10-08 DIAGNOSIS — Z1231 Encounter for screening mammogram for malignant neoplasm of breast: Secondary | ICD-10-CM

## 2013-10-17 ENCOUNTER — Ambulatory Visit (HOSPITAL_COMMUNITY)
Admission: RE | Admit: 2013-10-17 | Discharge: 2013-10-17 | Disposition: A | Discharge status: Home / Self Care | Payer: Private Health Insurance - Indemnity | Source: Ambulatory Visit | Attending: Obstetrics and Gynecology | Admitting: Obstetrics and Gynecology

## 2013-10-17 DIAGNOSIS — Z1231 Encounter for screening mammogram for malignant neoplasm of breast: Secondary | ICD-10-CM | POA: Diagnosis not present

## 2014-04-21 ENCOUNTER — Emergency Department (HOSPITAL_COMMUNITY)
Admission: EM | Admit: 2014-04-21 | Discharge: 2014-04-21 | Disposition: A | Discharge status: Home / Self Care | Payer: Managed Care, Other (non HMO) | Attending: Emergency Medicine | Admitting: Emergency Medicine

## 2014-04-21 ENCOUNTER — Encounter (HOSPITAL_COMMUNITY): Payer: Self-pay | Admitting: Emergency Medicine

## 2014-04-21 DIAGNOSIS — S161XXA Strain of muscle, fascia and tendon at neck level, initial encounter: Secondary | ICD-10-CM | POA: Insufficient documentation

## 2014-04-21 DIAGNOSIS — Y9389 Activity, other specified: Secondary | ICD-10-CM | POA: Diagnosis not present

## 2014-04-21 DIAGNOSIS — D649 Anemia, unspecified: Secondary | ICD-10-CM | POA: Insufficient documentation

## 2014-04-21 DIAGNOSIS — Y998 Other external cause status: Secondary | ICD-10-CM | POA: Diagnosis not present

## 2014-04-21 DIAGNOSIS — Z8619 Personal history of other infectious and parasitic diseases: Secondary | ICD-10-CM | POA: Insufficient documentation

## 2014-04-21 DIAGNOSIS — Y92410 Unspecified street and highway as the place of occurrence of the external cause: Secondary | ICD-10-CM | POA: Diagnosis not present

## 2014-04-21 DIAGNOSIS — S0993XA Unspecified injury of face, initial encounter: Secondary | ICD-10-CM | POA: Diagnosis present

## 2014-04-21 DIAGNOSIS — Z79899 Other long term (current) drug therapy: Secondary | ICD-10-CM | POA: Insufficient documentation

## 2014-04-21 DIAGNOSIS — Z87891 Personal history of nicotine dependence: Secondary | ICD-10-CM | POA: Diagnosis not present

## 2014-04-21 MED ORDER — HYDROCODONE-ACETAMINOPHEN 5-325 MG PO TABS: 1.0000 | ORAL_TABLET | Freq: Four times a day (QID) | ORAL | Status: DC | PRN
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
## 2014-04-21 NOTE — Discharge Instructions (Signed)
Cervical Strain and Sprain (Whiplash) °with Rehab °Cervical strain and sprain are injuries that commonly occur with "whiplash" injuries. Whiplash occurs when the neck is forcefully whipped backward or forward, such as during a motor vehicle accident or during contact sports. The muscles, ligaments, tendons, discs, and nerves of the neck are susceptible to injury when this occurs. °RISK FACTORS °Risk of having a whiplash injury increases if: °· Osteoarthritis of the spine. °· Situations that make head or neck accidents or trauma more likely. °· High-risk sports (football, rugby, wrestling, hockey, auto racing, gymnastics, diving, contact karate, or boxing). °· Poor strength and flexibility of the neck. °· Previous neck injury. °· Poor tackling technique. °· Improperly fitted or padded equipment. °SYMPTOMS  °· Pain or stiffness in the front or back of neck or both. °· Symptoms may present immediately or up to 24 hours after injury. °· Dizziness, headache, nausea, and vomiting. °· Muscle spasm with soreness and stiffness in the neck. °· Tenderness and swelling at the injury site. °PREVENTION °· Learn and use proper technique (avoid tackling with the head, spearing, and head-butting; use proper falling techniques to avoid landing on the head). °· Warm up and stretch properly before activity. °· Maintain physical fitness: °· Strength, flexibility, and endurance. °· Cardiovascular fitness. °· Wear properly fitted and padded protective equipment, such as padded soft collars, for participation in contact sports. °PROGNOSIS  °Recovery from cervical strain and sprain injuries is dependent on the extent of the injury. These injuries are usually curable in 1 week to 3 months with appropriate treatment.  °RELATED COMPLICATIONS  °· Temporary numbness and weakness may occur if the nerve roots are damaged, and this may persist until the nerve has completely healed. °· Chronic pain due to frequent recurrence of  symptoms. °· Prolonged healing, especially if activity is resumed too soon (before complete recovery). °TREATMENT  °Treatment initially involves the use of ice and medication to help reduce pain and inflammation. It is also important to perform strengthening and stretching exercises and modify activities that worsen symptoms so the injury does not get worse. These exercises may be performed at home or with a therapist. For patients who experience severe symptoms, a soft, padded collar may be recommended to be worn around the neck.  °Improving your posture may help reduce symptoms. Posture improvement includes pulling your chin and abdomen in while sitting or standing. If you are sitting, sit in a firm chair with your buttocks against the back of the chair. While sleeping, try replacing your pillow with a small towel rolled to 2 inches in diameter, or use a cervical pillow or soft cervical collar. Poor sleeping positions delay healing.  °For patients with nerve root damage, which causes numbness or weakness, the use of a cervical traction apparatus may be recommended. Surgery is rarely necessary for these injuries. However, cervical strain and sprains that are present at birth (congenital) may require surgery. °MEDICATION  °· If pain medication is necessary, nonsteroidal anti-inflammatory medications, such as aspirin and ibuprofen, or other minor pain relievers, such as acetaminophen, are often recommended. °· Do not take pain medication for 7 days before surgery. °· Prescription pain relievers may be given if deemed necessary by your caregiver. Use only as directed and only as much as you need. °HEAT AND COLD:  °· Cold treatment (icing) relieves pain and reduces inflammation. Cold treatment should be applied for 10 to 15 minutes every 2 to 3 hours for inflammation and pain and immediately after any activity that aggravates   your symptoms. Use ice packs or an ice massage. °· Heat treatment may be used prior to  performing the stretching and strengthening activities prescribed by your caregiver, physical therapist, or athletic trainer. Use a heat pack or a warm soak. °SEEK MEDICAL CARE IF:  °· Symptoms get worse or do not improve in 2 weeks despite treatment. °· New, unexplained symptoms develop (drugs used in treatment may produce side effects). °EXERCISES °RANGE OF MOTION (ROM) AND STRETCHING EXERCISES - Cervical Strain and Sprain °These exercises may help you when beginning to rehabilitate your injury. In order to successfully resolve your symptoms, you must improve your posture. These exercises are designed to help reduce the forward-head and rounded-shoulder posture which contributes to this condition. Your symptoms may resolve with or without further involvement from your physician, physical therapist or athletic trainer. While completing these exercises, remember:  °· Restoring tissue flexibility helps normal motion to return to the joints. This allows healthier, less painful movement and activity. °· An effective stretch should be held for at least 20 seconds, although you may need to begin with shorter hold times for comfort. °· A stretch should never be painful. You should only feel a gentle lengthening or release in the stretched tissue. °STRETCH- Axial Extensors °· Lie on your back on the floor. You may bend your knees for comfort. Place a rolled-up hand towel or dish towel, about 2 inches in diameter, under the part of your head that makes contact with the floor. °· Gently tuck your chin, as if trying to make a "double chin," until you feel a gentle stretch at the base of your head. °· Hold __________ seconds. °Repeat __________ times. Complete this exercise __________ times per day.  °STRETCH - Axial Extension  °· Stand or sit on a firm surface. Assume a good posture: chest up, shoulders drawn back, abdominal muscles slightly tense, knees unlocked (if standing) and feet hip width apart. °· Slowly retract your  chin so your head slides back and your chin slightly lowers. Continue to look straight ahead. °· You should feel a gentle stretch in the back of your head. Be certain not to feel an aggressive stretch since this can cause headaches later. °· Hold for __________ seconds. °Repeat __________ times. Complete this exercise __________ times per day. °STRETCH - Cervical Side Bend  °· Stand or sit on a firm surface. Assume a good posture: chest up, shoulders drawn back, abdominal muscles slightly tense, knees unlocked (if standing) and feet hip width apart. °· Without letting your nose or shoulders move, slowly tip your right / left ear to your shoulder until your feel a gentle stretch in the muscles on the opposite side of your neck. °· Hold __________ seconds. °Repeat __________ times. Complete this exercise __________ times per day. °STRETCH - Cervical Rotators  °· Stand or sit on a firm surface. Assume a good posture: chest up, shoulders drawn back, abdominal muscles slightly tense, knees unlocked (if standing) and feet hip width apart. °· Keeping your eyes level with the ground, slowly turn your head until you feel a gentle stretch along the back and opposite side of your neck. °· Hold __________ seconds. °Repeat __________ times. Complete this exercise __________ times per day. °RANGE OF MOTION - Neck Circles  °· Stand or sit on a firm surface. Assume a good posture: chest up, shoulders drawn back, abdominal muscles slightly tense, knees unlocked (if standing) and feet hip width apart. °· Gently roll your head down and around from the   back of one shoulder to the back of the other. The motion should never be forced or painful. °· Repeat the motion 10-20 times, or until you feel the neck muscles relax and loosen. °Repeat __________ times. Complete the exercise __________ times per day. °STRENGTHENING EXERCISES - Cervical Strain and Sprain °These exercises may help you when beginning to rehabilitate your injury. They may  resolve your symptoms with or without further involvement from your physician, physical therapist, or athletic trainer. While completing these exercises, remember:  °· Muscles can gain both the endurance and the strength needed for everyday activities through controlled exercises. °· Complete these exercises as instructed by your physician, physical therapist, or athletic trainer. Progress the resistance and repetitions only as guided. °· You may experience muscle soreness or fatigue, but the pain or discomfort you are trying to eliminate should never worsen during these exercises. If this pain does worsen, stop and make certain you are following the directions exactly. If the pain is still present after adjustments, discontinue the exercise until you can discuss the trouble with your clinician. °STRENGTH - Cervical Flexors, Isometric °· Face a wall, standing about 6 inches away. Place a small pillow, a ball about 6-8 inches in diameter, or a folded towel between your forehead and the wall. °· Slightly tuck your chin and gently push your forehead into the soft object. Push only with mild to moderate intensity, building up tension gradually. Keep your jaw and forehead relaxed. °· Hold 10 to 20 seconds. Keep your breathing relaxed. °· Release the tension slowly. Relax your neck muscles completely before you start the next repetition. °Repeat __________ times. Complete this exercise __________ times per day. °STRENGTH- Cervical Lateral Flexors, Isometric  °· Stand about 6 inches away from a wall. Place a small pillow, a ball about 6-8 inches in diameter, or a folded towel between the side of your head and the wall. °· Slightly tuck your chin and gently tilt your head into the soft object. Push only with mild to moderate intensity, building up tension gradually. Keep your jaw and forehead relaxed. °· Hold 10 to 20 seconds. Keep your breathing relaxed. °· Release the tension slowly. Relax your neck muscles completely  before you start the next repetition. °Repeat __________ times. Complete this exercise __________ times per day. °STRENGTH - Cervical Extensors, Isometric  °· Stand about 6 inches away from a wall. Place a small pillow, a ball about 6-8 inches in diameter, or a folded towel between the back of your head and the wall. °· Slightly tuck your chin and gently tilt your head back into the soft object. Push only with mild to moderate intensity, building up tension gradually. Keep your jaw and forehead relaxed. °· Hold 10 to 20 seconds. Keep your breathing relaxed. °· Release the tension slowly. Relax your neck muscles completely before you start the next repetition. °Repeat __________ times. Complete this exercise __________ times per day. °POSTURE AND BODY MECHANICS CONSIDERATIONS - Cervical Strain and Sprain °Keeping correct posture when sitting, standing or completing your activities will reduce the stress put on different body tissues, allowing injured tissues a chance to heal and limiting painful experiences. The following are general guidelines for improved posture. Your physician or physical therapist will provide you with any instructions specific to your needs. While reading these guidelines, remember: °· The exercises prescribed by your provider will help you have the flexibility and strength to maintain correct postures. °· The correct posture provides the optimal environment for your joints to   work. All of your joints have less wear and tear when properly supported by a spine with good posture. This means you will experience a healthier, less painful body. °· Correct posture must be practiced with all of your activities, especially prolonged sitting and standing. Correct posture is as important when doing repetitive low-stress activities (typing) as it is when doing a single heavy-load activity (lifting). °PROLONGED STANDING WHILE SLIGHTLY LEANING FORWARD °When completing a task that requires you to lean  forward while standing in one place for a long time, place either foot up on a stationary 2- to 4-inch high object to help maintain the best posture. When both feet are on the ground, the low back tends to lose its slight inward curve. If this curve flattens (or becomes too large), then the back and your other joints will experience too much stress, fatigue more quickly, and can cause pain.  °RESTING POSITIONS °Consider which positions are most painful for you when choosing a resting position. If you have pain with flexion-based activities (sitting, bending, stooping, squatting), choose a position that allows you to rest in a less flexed posture. You would want to avoid curling into a fetal position on your side. If your pain worsens with extension-based activities (prolonged standing, working overhead), avoid resting in an extended position such as sleeping on your stomach. Most people will find more comfort when they rest with their spine in a more neutral position, neither too rounded nor too arched. Lying on a non-sagging bed on your side with a pillow between your knees, or on your back with a pillow under your knees will often provide some relief. Keep in mind, being in any one position for a prolonged period of time, no matter how correct your posture, can still lead to stiffness. °WALKING °Walk with an upright posture. Your ears, shoulders, and hips should all line up. °OFFICE WORK °When working at a desk, create an environment that supports good, upright posture. Without extra support, muscles fatigue and lead to excessive strain on joints and other tissues. °CHAIR: °· A chair should be able to slide under your desk when your back makes contact with the back of the chair. This allows you to work closely. °· The chair's height should allow your eyes to be level with the upper part of your monitor and your hands to be slightly lower than your elbows. °· Body position: °¨ Your feet should make contact with the  floor. If this is not possible, use a foot rest. °¨ Keep your ears over your shoulders. This will reduce stress on your neck and low back. °Document Released: 12/20/2004 Document Revised: 05/06/2013 Document Reviewed: 04/03/2008 °[employer]® Patient Information ©2015 [employer]. This information is not intended to replace advice given to you by your health care provider. Make sure you discuss any questions you have with your health care provider. °Motor Vehicle Collision °It is common to have multiple bruises and sore muscles after a motor vehicle collision (MVC). These tend to feel worse for the first 24 hours. You may have the most stiffness and soreness over the first several hours. You may also feel worse when you wake up the first morning after your collision. After this point, you will usually begin to improve with each day. The speed of improvement often depends on the severity of the collision, the number of injuries, and the location and nature of these injuries. °HOME CARE INSTRUCTIONS °· Put ice on the injured area. °¨ Put ice in a   plastic bag. °¨ Place a towel between your skin and the bag. °¨ Leave the ice on for 15-20 minutes, 3-4 times a day, or as directed by your health care provider. °· Drink enough fluids to keep your urine clear or pale yellow. Do not drink alcohol. °· Take a warm shower or bath once or twice a day. This will increase blood flow to sore muscles. °· You may return to activities as directed by your caregiver. Be careful when lifting, as this may aggravate neck or back pain. °· Only take over-the-counter or prescription medicines for pain, discomfort, or fever as directed by your caregiver. Do not use aspirin. This may increase bruising and bleeding. °SEEK IMMEDIATE MEDICAL CARE IF: °· You have numbness, tingling, or weakness in the arms or legs. °· You develop severe headaches not relieved with medicine. °· You have severe neck pain, especially tenderness in the middle of the back  of your neck. °· You have changes in bowel or bladder control. °· There is increasing pain in any area of the body. °· You have shortness of breath, light-headedness, dizziness, or fainting. °· You have chest pain. °· You feel sick to your stomach (nauseous), throw up (vomit), or sweat. °· You have increasing abdominal discomfort. °· There is blood in your urine, stool, or vomit. °· You have pain in your shoulder (shoulder strap areas). °· You feel your symptoms are getting worse. °MAKE SURE YOU: °· Understand these instructions. °· Will watch your condition. °· Will get help right away if you are not doing well or get worse. °Document Released: 12/20/2004 Document Revised: 05/06/2013 Document Reviewed: 05/19/2010 °[employer]® Patient Information ©2015 [employer]. This information is not intended to replace advice given to you by your health care provider. Make sure you discuss any questions you have with your health care provider. ° °

## 2015-01-04 DIAGNOSIS — I219 Acute myocardial infarction, unspecified: Secondary | ICD-10-CM

## 2015-01-04 HISTORY — DX: Acute myocardial infarction, unspecified: I21.9

## 2019-04-06 ENCOUNTER — Ambulatory Visit: Payer: Self-pay | Attending: Internal Medicine

## 2019-04-06 DIAGNOSIS — Z23 Encounter for immunization: Secondary | ICD-10-CM

## 2019-04-06 NOTE — Progress Notes (Signed)
   Covid-19 Vaccination Clinic  Name:  Yolanda Barr    MRN: [id number] DOB: Jan 02, 1966  04/06/2019  Ms. Corona was observed post Covid-19 immunization for 15 minutes without incident. She was provided with Vaccine Information Sheet and instruction to access the V-Safe system.   [name] was instructed to call 911 with any severe reactions post vaccine: Marland Kitchen Difficulty breathing  . Swelling of face and throat  . A fast heartbeat  . A bad rash all over body  . Dizziness and weakness   Immunizations Administered    Name Date Dose VIS Date Route   Moderna COVID-19 Vaccine 04/06/2019 11:19 AM 0.5 mL 12/04/2018 Intramuscular   Manufacturer: Moderna   Lot: [id number]   NDC: 76811-572-62

## 2019-05-04 ENCOUNTER — Ambulatory Visit: Payer: Medicaid Other | Attending: Critical Care Medicine

## 2019-05-04 DIAGNOSIS — Z23 Encounter for immunization: Secondary | ICD-10-CM

## 2019-05-04 NOTE — Progress Notes (Signed)
   Covid-19 Vaccination Clinic  Name:  Yolanda Barr    MRN: [id number] DOB: [birth year]  05/04/2019  Yolanda Barr was observed post Covid-19 immunization for 15 minutes without incident. She was provided with Vaccine Information Sheet and instruction to access the V-Safe system.   Yolanda Barr was instructed to call 911 with any severe reactions post vaccine: Marland Kitchen Difficulty breathing  . Swelling of face and throat  . A fast heartbeat  . A bad rash all over body  . Dizziness and weakness   Immunizations Administered    Name Date Dose VIS Date Route   Moderna COVID-19 Vaccine 05/04/2019 11:03 AM 0.5 mL 12/2018 Intramuscular   Manufacturer: Moderna   Lot: [id number]   NDC: 30856-943-70

## 2023-07-31 ENCOUNTER — Ambulatory Visit (INDEPENDENT_AMBULATORY_CARE_PROVIDER_SITE_OTHER)

## 2023-07-31 ENCOUNTER — Encounter: Payer: Self-pay | Admitting: Hematology and Oncology

## 2023-07-31 ENCOUNTER — Ambulatory Visit (HOSPITAL_COMMUNITY): Admission: EM | Admit: 2023-07-31 | Discharge: 2023-07-31 | Disposition: A | Discharge status: Home / Self Care

## 2023-07-31 ENCOUNTER — Encounter (HOSPITAL_COMMUNITY): Payer: Self-pay

## 2023-07-31 DIAGNOSIS — S5012XA Contusion of left forearm, initial encounter: Secondary | ICD-10-CM | POA: Diagnosis not present

## 2023-07-31 DIAGNOSIS — I10 Essential (primary) hypertension: Secondary | ICD-10-CM | POA: Diagnosis not present

## 2023-07-31 DIAGNOSIS — M25532 Pain in left wrist: Secondary | ICD-10-CM

## 2023-07-31 DIAGNOSIS — R040 Epistaxis: Secondary | ICD-10-CM

## 2023-07-31 HISTORY — DX: Essential (primary) hypertension: I10

## 2023-07-31 HISTORY — DX: Atherosclerotic heart disease of native coronary artery without angina pectoris: I25.10

## 2023-07-31 MED ORDER — IBUPROFEN 600 MG PO TABS: 600.0000 mg | ORAL_TABLET | Freq: Four times a day (QID) | ORAL | 0 refills | Status: AC | PRN

## 2023-07-31 NOTE — Discharge Instructions (Addendum)
  1. Epistaxis (Primary) 2. Essential hypertension - As discussed epistaxis is most likely due to elevated blood pressure.  Most nosebleeds are secondary to rupturing of small capillaries in the nasal mucosa causing excessive bleeding. - Continue monitor blood pressure if continue to have elevated blood pressure concerns follow-up with primary care provider for further evaluation and medication management.  3. Contusion of left forearm, initial encounter - DG Forearm Left x-ray performed in [facility] shows no acute fracture or acute bony injury.  Most likely soft tissue contusion. - ibuprofen  (ADVIL ) 600 MG tablet; Take 1 tablet (600 mg total) by mouth every 6 (six) hours as needed.  Dispense: 30 tablet; Refill: 0 -Continue to monitor symptoms for any change in severity if there is any escalation of current symptoms or development of new symptoms follow-up in ER for further evaluation and management.

## 2023-07-31 NOTE — ED Triage Notes (Signed)
 Pt c/o having nosebleed multiple times since Saturday lasting 10-89mins each episode.   Pt states stepped on a rock yesterday and fell on lt arm. C/o lt forearm pain. States took tylenol  with relief.

## 2023-07-31 NOTE — ED Provider Notes (Signed)
 [facility]  Note:  This document was prepared using Dragon voice recognition software and may include unintentional dictation errors.  MRN: [id number] DOB: [birth year]  Subjective:   Yolanda Barr is a 58 y.o. female presenting for pain to the left forearm after a fall that occurred yesterday at church as well as intermittent epistaxis that has been going on since Saturday.  Patient reports that yesterday she tripped over a rock walking out of church and fell landing on her left forearm.  Patient reports taking Tylenol  with minimal improvement to her symptoms.  Patient denies any prior injury or trauma to the left arm.  Patient also reports since Saturday she has had multiple nosebleeds lasting about 10 to 15 minutes each time.  Denies any nasal trauma or injury.  Patient does admit to having high blood pressure which is somewhat uncontrolled.  Patient has reached out to her primary care provider but but has not received any response from PCP at this time.  No current facility-administered medications for this encounter.  Current Outpatient Medications:    ibuprofen  (ADVIL ) 600 MG tablet, Take 1 tablet (600 mg total) by mouth every 6 (six) hours as needed., Disp: 30 tablet, Rfl: 0   nitroGLYCERIN (NITROSTAT) 0.4 MG SL tablet, PLACE 1 TAB UNDER THE TONGUE EVERY 5 MINS AS NEEDED FOR CHEST PAIN. CALL 911 AFTER 3RD DOSE, Disp: , Rfl:    ASPIRIN LOW DOSE 81 MG tablet, Take 81 mg by mouth daily., Disp: , Rfl:    carvedilol (COREG) 12.5 MG tablet, Take 12.5 mg by mouth 2 (two) times daily., Disp: , Rfl:    losartan (COZAAR) 100 MG tablet, Take 100 mg by mouth daily., Disp: , Rfl:    Multiple Vitamins-Iron  (ONE-TABLET-DAILY/IRON ) TABS, Take 1 tablet by mouth daily.  , Disp: , Rfl:    No Known Allergies  Past Medical History:  Diagnosis Date   Anemia    Iron  deficiency anemia   Coronary artery disease    Herpes zoster    vaginalis   Hypertension    MI (myocardial infarction)  (HCC) 2017     History reviewed. No pertinent surgical history.  Family History  Problem Relation Age of Onset   Cancer Maternal Grandmother    Lung cancer Paternal Grandfather     Social History   Tobacco Use   Smoking status: Former    Current packs/day: 0.25    Average packs/day: 0.3 packs/day for 1 year (0.3 ttl pk-yrs)    Types: Cigarettes  Vaping Use   Vaping status: Never Used  Substance Use Topics   Alcohol use: Yes    Comment: occ   Drug use: Not Currently    ROS Refer to HPI for ROS details.  Objective:   Vitals: BP (!) 178/84 (BP Location: Right Arm)   Pulse (!) 50   Temp 98.5 F (36.9 C) (Oral)   Resp 18   LMP  (Exact Date)   SpO2 97%   Physical Exam Vitals and nursing note reviewed.  Constitutional:      General: She is not in acute distress.    Appearance: Normal appearance. She is well-developed. She is not ill-appearing or toxic-appearing.  HENT:     Head: Normocephalic and atraumatic.     Nose: Mucosal edema and congestion present. No nasal tenderness or rhinorrhea.     Right Nostril: No foreign body, epistaxis or septal hematoma.     Left Nostril: No foreign body, epistaxis or septal hematoma.  Right Turbinates: Enlarged and swollen.     Left Turbinates: Not enlarged or swollen.     Right Sinus: No maxillary sinus tenderness or frontal sinus tenderness.     Left Sinus: No maxillary sinus tenderness or frontal sinus tenderness.  Cardiovascular:     Rate and Rhythm: Normal rate.  Pulmonary:     Effort: Pulmonary effort is normal. No respiratory distress.  Musculoskeletal:        General: Normal range of motion.     Left forearm: Tenderness present. No swelling, edema or bony tenderness.  Skin:    General: Skin is warm and dry.  Neurological:     General: No focal deficit present.     Mental Status: She is alert and oriented to person, place, and time.  Psychiatric:        Mood and Affect: Mood normal.        Behavior: Behavior  normal.     Procedures  No results found for this or any previous visit (from the past 24 hours).  DG Forearm Left Result Date: 07/31/2023 CLINICAL DATA:  Left forearm pain.  Recent trauma EXAM: LEFT FOREARM - 2 VIEW COMPARISON:  None Available. FINDINGS: There is no evidence of fracture or other focal bone lesions. Soft tissues are unremarkable. IMPRESSION: No acute osseous abnormality. Electronically Signed   By: Ranell Bring M.D.   On: 07/31/2023 13:55     Assessment and Plan :     Discharge Instructions       1. Epistaxis (Primary) 2. Essential hypertension - As discussed epistaxis is most likely due to elevated blood pressure.  Most nosebleeds are secondary to rupturing of small capillaries in the nasal mucosa causing excessive bleeding. - Continue monitor blood pressure if continue to have elevated blood pressure concerns follow-up with primary care provider for further evaluation and medication management.  3. Contusion of left forearm, initial encounter - DG Forearm Left x-ray performed in UC shows no acute fracture or acute bony injury.  Most likely soft tissue contusion. - ibuprofen  (ADVIL ) 600 MG tablet; Take 1 tablet (600 mg total) by mouth every 6 (six) hours as needed.  Dispense: 30 tablet; Refill: 0 -Continue to monitor symptoms for any change in severity if there is any escalation of current symptoms or development of new symptoms follow-up in ER for further evaluation and management.      Tremaine Earwood B Kassidee Narciso   [place], [place], [place] 07/31/23 1424
